# Patient Record
Sex: Female | Born: 1955 | Race: White | Hispanic: No | Marital: Married | Smoking: Never smoker
Health system: Southern US, Community
[De-identification: ages and names within clinical notes are randomized; demographics above are authoritative.]

## PROBLEM LIST (undated history)

## (undated) DIAGNOSIS — E559 Vitamin D deficiency, unspecified: Secondary | ICD-10-CM

## (undated) DIAGNOSIS — T7840XA Allergy, unspecified, initial encounter: Secondary | ICD-10-CM

## (undated) HISTORY — DX: Allergy, unspecified, initial encounter: T78.40XA

## (undated) HISTORY — DX: Vitamin D deficiency, unspecified: E55.9

---

## 2006-12-30 ENCOUNTER — Ambulatory Visit: Payer: Self-pay | Admitting: Gastroenterology

## 2010-06-16 ENCOUNTER — Ambulatory Visit: Payer: Self-pay | Admitting: Internal Medicine

## 2010-06-25 ENCOUNTER — Ambulatory Visit: Payer: Self-pay | Admitting: Gastroenterology

## 2010-06-29 LAB — PATHOLOGY REPORT

## 2011-06-26 ENCOUNTER — Ambulatory Visit: Payer: Self-pay | Admitting: Family Medicine

## 2013-04-15 ENCOUNTER — Emergency Department: Payer: Self-pay | Admitting: Emergency Medicine

## 2013-11-25 DIAGNOSIS — J069 Acute upper respiratory infection, unspecified: Secondary | ICD-10-CM | POA: Insufficient documentation

## 2014-10-05 DIAGNOSIS — R1012 Left upper quadrant pain: Secondary | ICD-10-CM | POA: Insufficient documentation

## 2014-10-05 DIAGNOSIS — Z86010 Personal history of colonic polyps: Secondary | ICD-10-CM | POA: Insufficient documentation

## 2014-10-17 ENCOUNTER — Ambulatory Visit: Payer: Self-pay | Admitting: Gastroenterology

## 2014-11-18 LAB — HM PAP SMEAR: HM PAP: NEGATIVE

## 2014-12-26 LAB — SURGICAL PATHOLOGY

## 2015-06-08 ENCOUNTER — Telehealth: Payer: Self-pay

## 2015-06-08 NOTE — Telephone Encounter (Signed)
Noted-jh 

## 2015-06-08 NOTE — Telephone Encounter (Signed)
Pt has been aware that she has fibrocystic breast and recommendation for 6 month but she is declined and wants to get yearly diagnostic mammogram next appointment is 10/11/2015 and she will schedule her own appointment too ? Nisha

## 2015-10-11 LAB — HM MAMMOGRAPHY: HM Mammogram: NEGATIVE

## 2015-12-08 ENCOUNTER — Telehealth: Payer: Self-pay | Admitting: Family Medicine

## 2015-12-08 NOTE — Telephone Encounter (Signed)
Pt was exposed to pertussis at work.  She checked her My Chart and could not find documentation of the TDap shot she got in March.  Will you check and make sure it is in her records..  She will need proof of that for work.  Her call back number is [phone]

## 2015-12-08 NOTE — Telephone Encounter (Signed)
Tdap updated from [employer].

## 2016-03-07 ENCOUNTER — Ambulatory Visit (INDEPENDENT_AMBULATORY_CARE_PROVIDER_SITE_OTHER): Payer: Managed Care, Other (non HMO) | Admitting: Family Medicine

## 2016-03-07 ENCOUNTER — Encounter: Payer: Self-pay | Admitting: Family Medicine

## 2016-03-07 VITALS — BP 123/79 | HR 65 | Temp 98.5°F | Resp 16 | Ht 68.0 in | Wt 172.4 lb

## 2016-03-07 DIAGNOSIS — Z01419 Encounter for gynecological examination (general) (routine) without abnormal findings: Secondary | ICD-10-CM | POA: Diagnosis not present

## 2016-03-07 DIAGNOSIS — N816 Rectocele: Secondary | ICD-10-CM

## 2016-03-07 DIAGNOSIS — K5901 Slow transit constipation: Secondary | ICD-10-CM | POA: Diagnosis not present

## 2016-03-07 DIAGNOSIS — E559 Vitamin D deficiency, unspecified: Secondary | ICD-10-CM | POA: Diagnosis not present

## 2016-03-07 DIAGNOSIS — K5900 Constipation, unspecified: Secondary | ICD-10-CM | POA: Insufficient documentation

## 2016-03-07 LAB — LIPID PANEL
CHOLESTEROL: 174 mg/dL (ref 125–200)
HDL: 51 mg/dL (ref >=46)
LDL Cholesterol: 101 mg/dL (ref <130)
TRIGLYCERIDES: 112 mg/dL (ref <150)
Total CHOL/HDL Ratio: 3.4 Ratio (ref <=5.0)
VLDL: 22 mg/dL (ref <30)

## 2016-03-07 LAB — COMPLETE METABOLIC PANEL WITH GFR
ALT: 14 U/L (ref 6–29)
AST: 15 U/L (ref 10–35)
Albumin: 4.1 g/dL (ref 3.6–5.1)
Alkaline Phosphatase: 53 U/L (ref 33–130)
BUN: 14 mg/dL (ref 7–25)
CALCIUM: 9.2 mg/dL (ref 8.6–10.4)
CHLORIDE: 105 mmol/L (ref 98–110)
CO2: 28 mmol/L (ref 20–31)
Creat: 0.85 mg/dL (ref 0.50–0.99)
GFR, EST AFRICAN AMERICAN: 86 mL/min (ref >=60)
GFR, Est Non African American: 75 mL/min (ref >=60)
Glucose, Bld: 92 mg/dL (ref 65–99)
Potassium: 4.5 mmol/L (ref 3.5–5.3)
Sodium: 140 mmol/L (ref 135–146)
TOTAL PROTEIN: 6.4 g/dL (ref 6.1–8.1)
Total Bilirubin: 0.8 mg/dL (ref 0.2–1.2)

## 2016-03-07 NOTE — Assessment & Plan Note (Signed)
Small. Expectant management at this time.. Discussed treatment options- plan to control constipation. Encouraged pelvic muscle strengthening.

## 2016-03-07 NOTE — Assessment & Plan Note (Signed)
Encouraged drinking plenty of fluids. Adding PRN benefiber or metamucil to help with regularity. Encouraged attempting BM after breakfast. Consider medication if not improving.

## 2016-03-07 NOTE — Patient Instructions (Signed)
Piriformis Syndrome With Rehab Piriformis syndrome is a condition the affects the nervous system in the area of the hip, and is characterized by pain and possibly a loss of feeling in the backside (posterior) thigh that may extend down the entire length of the leg. The symptoms are caused by an increase in pressure on the sciatic nerve by the piriformis muscle, which is on the back of the hip and is responsible for externally rotating the hip. The sciatic nerve and its branches connect to much of the leg. Normally the sciatic nerve runs between the piriformis muscle and other muscles. However, in certain individuals the nerve runs through the muscle, which causes an increase in pressure on the nerve and results in the symptoms of piriformis syndrome. SYMPTOMS   Pain, tingling, numbness, or burning in the back of the thigh that may also extend down the entire leg.  Occasionally, tenderness in the buttock.  Loss of function of the leg.  Pain that worsens when using the piriformis muscle (running, jumping, or stairs).  Pain that increases with prolonged sitting.  Pain that is lessened by lying flat on the back. CAUSES   Piriformis syndrome is the result of an increase in pressure placed on the sciatic nerve. Oftentimes, piriformis syndrome is an overuse injury.  Stress placed on the nerve from a sudden increase in the intensity, frequency, or duration of training.  Compensation of other extremity injuries. RISK INCREASES WITH:  Sports that involve the piriformis muscle (running, walking, or jumping).  You are born with (congenital) a defect in which the sciatic nerve passes through the muscle. PREVENTION  Warm up and stretch properly before activity.  Allow for adequate recovery between workouts.  Maintain physical fitness:  Strength, flexibility, and endurance.  Cardiovascular fitness. PROGNOSIS  If treated properly, the symptoms of piriformis syndrome usually resolve in 2 to 6  weeks. RELATED COMPLICATIONS   Persistent and possibly permanent pain and numbness in the lower extremity.  Weakness of the extremity that may progress to disability and inability to compete. TREATMENT  The most effective treatment for piriformis syndrome is rest from any activities that aggravate the symptoms. Ice and pain medication may help reduce pain and inflammation. The use of strengthening and stretching exercises may help reduce pain with activity. These exercises may be performed at home or with a therapist. A referral to a therapist may be given for further evaluation and treatment, such as ultrasound. Corticosteroid injections may be given to reduce inflammation that is causing pressure to be placed on the sciatic nerve. If nonsurgical (conservative) treatment is unsuccessful, then surgery may be recommended.  MEDICATION   If pain medication is necessary, then nonsteroidal anti-inflammatory medications, such as aspirin and ibuprofen, or other minor pain relievers, such as acetaminophen, are often recommended.  Do not take pain medication for 7 days before surgery.  Prescription pain relievers may be given if deemed necessary by your caregiver. Use only as directed and only as much as you need.  Corticosteroid injections may be given by your caregiver. These injections should be reserved for the most serious cases, because they may only be given a certain number of times. HEAT AND COLD:   Cold treatment (icing) relieves pain and reduces inflammation. Cold treatment should be applied for 10 to 15 minutes every 2 to 3 hours for inflammation and pain and immediately after any activity that aggravates your symptoms. Use ice packs or massage the area with a piece of ice (ice massage).  Heat   treatment may be used prior to performing the stretching and strengthening activities prescribed by your caregiver, physical therapist, or athletic trainer. Use a heat pack or soak the injury in warm  water. SEEK IMMEDIATE MEDICAL CARE IF:  Treatment seems to offer no benefit, or the condition worsens.  Any medications produce adverse side effects. EXERCISES RANGE OF MOTION (ROM) AND STRETCHING EXERCISES - Piriformis Syndrome These exercises may help you when beginning to rehabilitate your injury. Your symptoms may resolve with or without further involvement from your physician, physical therapist, or athletic trainer. While completing these exercises, remember:   Restoring tissue flexibility helps normal motion to return to the joints. This allows healthier, less painful movement and activity.  An effective stretch should be held for at least 30 seconds.  A stretch should never be painful. You should only feel a gentle lengthening or release in the stretched tissue. STRETCH - Hip Rotators  Lie on your back on a firm surface. Grasp your right / left knee with your right / left hand and your ankle with your opposite hand.  Keeping your hips and shoulders firmly planted, gently pull your right / left knee and rotate your lower leg toward your opposite shoulder until you feel a stretch in your buttocks.  Hold this stretch for __________ seconds. Repeat this stretch __________ times. Complete this stretch __________ times per day. STRETCH - Iliotibial Band  On the floor or bed, lie on your side so your right / left leg is on top. Bend your knee and grab your ankle.  Slowly bring your knee back so that your thigh is in line with your trunk. Keep your heel at your buttocks and gently arch your back so your head, shoulders, and hips line up.  Slowly lower your leg so that your knee approaches the floor/bed until you feel a gentle stretch on the outside of your right / left thigh. If you do not feel a stretch and your knee will not fall farther, place the heel of your opposite foot on top of your knee and pull your thigh down farther.  Hold this stretch for __________ seconds. Repeat  __________ times. Complete __________ times per day. STRENGTHENING EXERCISES - Piriformis Syndrome  These are some of the caregiver again or until your symptoms are resolved. Remember:   Strong muscles with good endurance tolerate stress better.  Do the exercises as initially prescribed by your caregiver. Progress slowly with each exercise, gradually increasing the number of repetitions and weight used under their guidance. STRENGTH - Hip Abductors, Straight Leg Raises Be aware of your form throughout the entire exercise so that you exercise the correct muscles. Sloppy form means that you are not strengthening the correct muscles.  Lie on your side so that your head, shoulders, knee, and hip line up. You may bend your lower knee to help maintain your balance. Your right / left leg should be on top.  Roll your hips slightly forward, so that your hips are stacked directly over each other and your right / left knee is facing forward.  Lift your top leg up 4-6 inches, leading with your heel. Be sure that your foot does not drift forward or that your knee does not roll toward the ceiling.  Hold this position for __________ seconds. You should feel the muscles in your outer hip lifting (you may not notice this until your leg begins to tire).  Slowly lower your leg to the starting position. Allow the muscles to fully   relax before beginning the next repetition. Repeat __________ times. Complete this exercise __________ times per day.  STRENGTH - Hip Abductors, Quadruped  On a firm, lightly padded surface, position yourself on your hands and knees. Your hands should be directly below your shoulders and your knees should be directly below your hips.  Keeping your right / left knee bent, lift your leg out to the side. Keep your legs level and in line with your shoulders.  Position yourself on your hands and knees.  Hold for __________ seconds.  Keeping your trunk steady and your hips level, slowly  lower your leg to the starting position. Repeat __________ times. Complete this exercise __________ times per day.  STRENGTH - Hip Abductors, Standing  Tie one end of a rubber exercise band/tubing to a secure surface (table, pole) and tie a loop at the other end.  Place the loop around your right / left ankle. Keeping your ankle with the band directly opposite of the secured end, step away until there is tension in the tube/band.  Hold onto a chair as needed for balance.  Keeping your back upright, your shoulders over your hips, and your toes pointing forward, lift your right / left leg out to your side. Be sure to lift your leg with your hip muscles. Do not "throw" your leg or tip your body to lift your leg.  Slowly and with control, return to the starting position. Repeat exercise __________ times. Complete this exercise __________ times per day.    This information is not intended to replace advice given to you by your health care provider. Make sure you discuss any questions you have with your health care provider.   Document Released: 08/19/2005 Document Revised: 01/03/2015 Document Reviewed: 12/01/2008 Elsevier Interactive Patient Education 2016 [employer]  

## 2016-03-07 NOTE — Assessment & Plan Note (Signed)
Check vitamin D levels today. 

## 2016-03-07 NOTE — Progress Notes (Signed)
Subjective:    Patient ID: Alexandra Ellis, female    DOB: Feb 12, 1956, 60 y.o.   MRN: [id number]  HPI: Alexandra Ellis is a 60 y.o. female presenting on 03/07/2016 for Annual Exam   HPI  Pt presents for well-woman exam. Overall doing well at home. Had mammogram 10/11/2015- normal. Had pap smear March 2016- normal and HPV negative.  Constipation: Trying to drink lots of fluids. Has a BM every 3 days. Occasional straining. Type 2-3 on [facility] stool chart.  Recto coele- recent- noticed 3 mos ago. Felt it all the sudden there. No incontinence. Not bulging outside the introitous. Makes it harder to have a bowel movement.  Working on exercise and meditation.  Bilateral thumb pain- doesn't prevent her from activities. Got better with prednisone.   Past Medical History  Diagnosis Date  . Vitamin D deficiency   . Allergy     minor   Social History   Social History  . Marital Status: Married    Spouse Name: N/A  . Number of Children: N/A  . Years of Education: N/A   Occupational History  . Not on file.   Social History Main Topics  . Smoking status: Never Smoker   . Smokeless tobacco: Not on file  . Alcohol Use: Yes  . Drug Use: No  . Sexual Activity: Not on file   Other Topics Concern  . Not on file   Social History Narrative  . No narrative on file   Family History  Problem Relation Age of Onset  . Stroke Mother   . Cancer Mother     skin  . Heart disease Father   . Cancer Father     skin  . Diabetes Father    No current outpatient prescriptions on file prior to visit.   No current facility-administered medications on file prior to visit.    Review of Systems  Constitutional: Negative for fever and chills.  HENT: Negative.   Respiratory: Negative for cough, chest tightness and wheezing.   Cardiovascular: Negative for chest pain and leg swelling.  Gastrointestinal: Negative for nausea, vomiting, abdominal pain, diarrhea and constipation.  Endocrine: Negative.   Negative for cold intolerance, heat intolerance, polydipsia, polyphagia and polyuria.  Genitourinary: Negative for dysuria, vaginal bleeding, vaginal discharge, difficulty urinating, vaginal pain and pelvic pain.       Rectocele or bulge in vagina.   Musculoskeletal: Negative.   Neurological: Negative for dizziness, light-headedness and numbness.  Psychiatric/Behavioral: Negative.    Per HPI unless specifically indicated above     Objective:    BP 123/79 mmHg  Pulse 65  Temp(Src) 98.5 F (36.9 C) (Oral)  Resp 16  Ht 5\' 8"  (1.727 m)  Wt 172 lb 6.4 oz (78.2 kg)  BMI 26.22 kg/m2  LMP   Wt Readings from Last 3 Encounters:  03/07/16 172 lb 6.4 oz (78.2 kg)    Physical Exam  Constitutional: She is oriented to person, place, and time. She appears well-developed and well-nourished.  HENT:  Head: Normocephalic and atraumatic.  Neck: Normal range of motion. Neck supple. No thyromegaly present.  Cardiovascular: Normal rate and regular rhythm.  Exam reveals no gallop and no friction rub.   No murmur heard. Pulmonary/Chest: Breath sounds normal. No respiratory distress. She has no wheezes. Right breast exhibits no inverted nipple, no mass, no nipple discharge, no skin change and no tenderness. Left breast exhibits no inverted nipple, no mass, no nipple discharge, no skin change and no tenderness. Breasts are symmetrical.  Abdominal: Soft. Bowel sounds are normal. She exhibits no distension. There is no tenderness.  Genitourinary: Vagina normal and uterus normal. No breast swelling, tenderness or discharge. There is no rash or tenderness on the right labia. There is no rash, tenderness or lesion on the left labia. Uterus is not tender. Cervix exhibits no motion tenderness. Right adnexum displays no mass, no tenderness and no fullness. Left adnexum displays no mass, no tenderness and no fullness. No tenderness in the vagina.  Small rectocele felt when patient bears down.   Musculoskeletal: Normal  range of motion. She exhibits no edema or tenderness.  Lymphadenopathy:    She has no cervical adenopathy.  Neurological: She is alert and oriented to person, place, and time.  Skin: Skin is warm and dry.  Psychiatric: She has a normal mood and affect. Her behavior is normal. Judgment normal.   Results for orders placed or performed in visit on 03/07/16  HM PAP SMEAR  Result Value Ref Range   HM Pap smear negative       Assessment & Plan:   Problem List Items Addressed This Visit      Digestive   Constipation    Encouraged drinking plenty of fluids. Adding PRN benefiber or metamucil to help with regularity. Encouraged attempting BM after breakfast. Consider medication if not improving.       Rectocele    Small. Expectant management at this time.. Discussed treatment options- plan to control constipation. Encouraged pelvic muscle strengthening.         Other   Vitamin D deficiency    Check vitamin D levels today.       Relevant Orders   VITAMIN D 25 Hydroxy (Vit-D Deficiency, Fractures)    Other Visit Diagnoses    Well woman exam with routine gynecological exam    -  Primary    Health maintenance reviewed. Pap due 2021.     Relevant Orders    Lipid Profile    COMPLETE METABOLIC PANEL WITH GFR       Meds ordered this encounter  Medications  . cholecalciferol (VITAMIN D) 1000 units tablet    Sig: Take 1,000 Units by mouth daily. Pt takes 3000 units daily.  . calcium carbonate (OSCAL) 1500 (600 Ca) MG TABS tablet    Sig: Take by mouth daily.      Follow up plan: No Follow-up on file.

## 2016-03-08 LAB — VITAMIN D 25 HYDROXY (VIT D DEFICIENCY, FRACTURES): VIT D 25 HYDROXY: 58 ng/mL (ref 30–100)

## 2018-04-30 ENCOUNTER — Other Ambulatory Visit (HOSPITAL_COMMUNITY)
Admission: RE | Admit: 2018-04-30 | Discharge: 2018-04-30 | Disposition: A | Discharge status: Home / Self Care | Payer: Managed Care, Other (non HMO) | Source: Ambulatory Visit | Attending: Nurse Practitioner | Admitting: Nurse Practitioner

## 2018-04-30 ENCOUNTER — Telehealth: Payer: Self-pay | Admitting: Nurse Practitioner

## 2018-04-30 ENCOUNTER — Ambulatory Visit (INDEPENDENT_AMBULATORY_CARE_PROVIDER_SITE_OTHER): Payer: Managed Care, Other (non HMO) | Admitting: Nurse Practitioner

## 2018-04-30 ENCOUNTER — Other Ambulatory Visit: Payer: Self-pay

## 2018-04-30 ENCOUNTER — Encounter: Payer: Self-pay | Admitting: Nurse Practitioner

## 2018-04-30 VITALS — BP 119/70 | HR 67 | Temp 98.2°F | Ht 68.0 in | Wt 177.6 lb

## 2018-04-30 DIAGNOSIS — Z1151 Encounter for screening for human papillomavirus (HPV): Secondary | ICD-10-CM | POA: Diagnosis not present

## 2018-04-30 DIAGNOSIS — Z124 Encounter for screening for malignant neoplasm of cervix: Secondary | ICD-10-CM | POA: Diagnosis not present

## 2018-04-30 DIAGNOSIS — Z1211 Encounter for screening for malignant neoplasm of colon: Secondary | ICD-10-CM

## 2018-04-30 DIAGNOSIS — Z1231 Encounter for screening mammogram for malignant neoplasm of breast: Secondary | ICD-10-CM | POA: Diagnosis not present

## 2018-04-30 DIAGNOSIS — G5703 Lesion of sciatic nerve, bilateral lower limbs: Secondary | ICD-10-CM

## 2018-04-30 DIAGNOSIS — Z13820 Encounter for screening for osteoporosis: Secondary | ICD-10-CM

## 2018-04-30 DIAGNOSIS — Z1239 Encounter for other screening for malignant neoplasm of breast: Secondary | ICD-10-CM

## 2018-04-30 DIAGNOSIS — Z0000 Encounter for general adult medical examination without abnormal findings: Secondary | ICD-10-CM | POA: Diagnosis not present

## 2018-04-30 NOTE — Telephone Encounter (Signed)
[place] and [place] at [facility].  Her call back number [phone]

## 2018-04-30 NOTE — Progress Notes (Signed)
Subjective:    Patient ID: Alexandra Ellis, female    DOB: March 26, 1956, 62 y.o.   MRN: [id number]  Alexandra Ellis is a 62 y.o. female presenting on 04/30/2018 for Annual Exam and Hip Pain (intermitent bilateral hip pain. That sometime disturb sleep. x 1week)   HPI Annual Physical Exam Patient has been feeling well.  They have acute concerns today regarding hip pain. Sleeps 7-8 hours per night interrupted x 1 for urination. Has had hip pain and interruption of sleep related to turning over.  Pain occurring with every position change.   Caring for mother-in-law in [place] who also has dementia.  Now in Memory care [facility]F.    HEALTH MAINTENANCE: Weight/BMI: overweight Physical activity: regular at home, good program Diet: generally heathy, smaller lunch, fewer carbs.  Preparing lunch more frequently. Reducing convenience foods/meals out. Seatbelt: always Sunscreen: wears usually if prolonged exposure PAP: due - no prior abnormal pap Mammogram: due - last 10/2015 DEXA: last about 15-20 years ago,  Colon Cancer Screen: due - was 3-5 years ago HIV/HEP C: negative - no new exposures Optometry: regular - Dr. Larence Penning Dentistry: Regular will be 2x per year  VACCINES: Tetanus: 2015 Influenza: will get with work Shingles: not interested   Hip pain Is taking 600mg  ibuprofen 1 dose at night with some relief. - Patient has had similar hip pain in past with diagnosis of piriformis syndrome and exercises that helped resolve pain.  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   Past Medical History:  Diagnosis Date  . Allergy    minor  . Vitamin D deficiency    No past surgical history on file. Social History   Socioeconomic History  . Marital status: Married    Spouse name: Not on file  . Number of children: Not on file  . Years of education: Not on file  . Highest education level: Not on file  Occupational  History  . Not on file  Social Needs  . Financial resource strain: Not on file  . Food insecurity:    Worry: Not on file    Inability: Not on file  . Transportation needs:    Medical: Not on file    Non-medical: Not on file  Tobacco Use  . Smoking status: Never Smoker  . Smokeless tobacco: Never Used  Substance and Sexual Activity  . Alcohol use: Yes    Comment: ocassional  . Drug use: No  . Sexual activity: Not on file  Lifestyle  . Physical activity:    Days per week: Not on file    Minutes per session: Not on file  . Stress: Not on file  Relationships  . Social connections:    Talks on phone: Not on file    Gets together: Not on file    Attends religious service: Not on file    Active member of club or organization: Not on file    Attends meetings of clubs or organizations: Not on file    Relationship status: Not on file  . Intimate partner violence:    Fear of current or ex partner: Not on file    Emotionally abused: Not on file    Physically abused: Not on file    Forced sexual activity: Not on file  Other Topics Concern  . Not on file  Social History Narrative  . Not on file   Family History  Problem Relation Age  of Onset  . Stroke Mother   . Cancer Mother        skin  . Heart disease Father   . Cancer Father        skin  . Diabetes Father    Current Outpatient Medications on File Prior to Visit  Medication Sig  . Calcium-Magnesium-Vitamin D (CALCIUM 500 PO) Take by mouth.  . cholecalciferol (VITAMIN D) 1000 units tablet Take 1,000 Units by mouth daily. Pt takes 3000 units daily.   No current facility-administered medications on file prior to visit.     Review of Systems  Constitutional: Negative for chills and fever.  HENT: Negative for congestion and sore throat.   Eyes: Negative for pain.  Respiratory: Negative for cough, shortness of breath and wheezing.   Cardiovascular: Negative for chest pain, palpitations and leg swelling.    Gastrointestinal: Negative for abdominal pain, blood in stool, constipation, diarrhea, nausea and vomiting.  Endocrine: Negative for polydipsia.  Genitourinary: Negative for dysuria, frequency, hematuria and urgency.  Musculoskeletal: Positive for myalgias (bilateral hips). Negative for back pain and neck pain.  Skin: Negative.  Negative for rash.  Allergic/Immunologic: Negative for environmental allergies.  Neurological: Negative for dizziness, weakness and headaches.  Hematological: Does not bruise/bleed easily.  Psychiatric/Behavioral: Positive for sleep disturbance (only last 7 days 2/2 hip pain). Negative for dysphoric mood and suicidal ideas. The patient is not nervous/anxious.    Per HPI unless specifically indicated above     Objective:    BP 119/70 (BP Location: Left Arm, Patient Position: Sitting, Cuff Size: Normal)   Pulse 67   Temp 98.2 F (36.8 C) (Oral)   Ht 5\' 8"  (1.727 m)   Wt 177 lb 9.6 oz (80.6 kg)   BMI 27.00 kg/m   Wt Readings from Last 3 Encounters:  04/30/18 177 lb 9.6 oz (80.6 kg)  03/07/16 172 lb 6.4 oz (78.2 kg)    Physical Exam  Constitutional: She is oriented to person, place, and time. She appears well-developed and well-nourished. No distress.  HENT:  Head: Normocephalic and atraumatic.  Right Ear: External ear normal.  Left Ear: External ear normal.  Nose: Nose normal.  Mouth/Throat: Oropharynx is clear and moist.  Eyes: Pupils are equal, round, and reactive to light. Conjunctivae are normal.  Neck: Normal range of motion. Neck supple. No JVD present. No tracheal deviation present. No thyromegaly present.  Cardiovascular: Normal rate, regular rhythm, normal heart sounds and intact distal pulses. Exam reveals no gallop and no friction rub.  No murmur heard. Pulmonary/Chest: Effort normal and breath sounds normal. No respiratory distress.  Abdominal: Soft. Bowel sounds are normal. She exhibits no distension. There is no hepatosplenomegaly. There  is no tenderness.  Genitourinary:  Genitourinary Comments: Breast - Normal exam w/ symmetric breasts, no mass, no nipple discharge, no skin changes or tenderness.    Normal external female genitalia without lesions or fusion. Vaginal canal without lesions, atrophic ctissues noted. Normal appearing cervix without lesions or friability. Physiologic discharge on exam. Bimanual exam without adnexal masses, enlarged uterus, or cervical motion tenderness.  Musculoskeletal: Normal range of motion.  Bilateral hips Inspection: Symmetric, no abnormality Palpation: Bilateral hips (lateral most aspect) with tenderness to palpation.  Muscles with hypertonicity. ROM: Normal hip AROM bilaterally Special Testing: none Strength: normal Neurovascular: normal sensation and cap refill, warm to touch    Lymphadenopathy:    She has no cervical adenopathy.  Neurological: She is alert and oriented to person, place, and time. No cranial nerve deficit.  Skin: Skin is warm and dry. Capillary refill takes less than 2 seconds.  Psychiatric: She has a normal mood and affect. Her behavior is normal. Judgment and thought content normal.  Nursing note and vitals reviewed.    Results for orders placed or performed in visit on 03/07/16  HM PAP SMEAR  Result Value Ref Range   HM Pap smear negative   VITAMIN D 25 Hydroxy (Vit-D Deficiency, Fractures)  Result Value Ref Range   Vit D, 25-Hydroxy 58 30 - 100 ng/mL  Lipid Profile  Result Value Ref Range   Cholesterol 174 125 - 200 mg/dL   Triglycerides 161 <096 mg/dL   HDL 51 >=04 mg/dL   Total CHOL/HDL Ratio 3.4 <=5.0 Ratio   VLDL 22 <30 mg/dL   LDL Cholesterol 540 <981 mg/dL  COMPLETE METABOLIC PANEL WITH GFR  Result Value Ref Range   Sodium 140 135 - 146 mmol/L   Potassium 4.5 3.5 - 5.3 mmol/L   Chloride 105 98 - 110 mmol/L   CO2 28 20 - 31 mmol/L   Glucose, Bld 92 65 - 99 mg/dL   BUN 14 7 - 25 mg/dL   Creat 1.91 4.78 - 2.95 mg/dL   Total Bilirubin 0.8 0.2 -  1.2 mg/dL   Alkaline Phosphatase 53 33 - 130 U/L   AST 15 10 - 35 U/L   ALT 14 6 - 29 U/L   Total Protein 6.4 6.1 - 8.1 g/dL   Albumin 4.1 3.6 - 5.1 g/dL   Calcium 9.2 8.6 - 62.1 mg/dL   GFR, Est African American 86 >=60 mL/min   GFR, Est Non African American 75 >=60 mL/min      Assessment & Plan:   Problem List Items Addressed This Visit    None    Visit Diagnoses    Breast cancer screening    -  Primary   Relevant Orders   MM 3D SCREEN BREAST BILATERAL   Osteoporosis screening       Relevant Orders   DG Bone Density   Encounter for annual physical exam       Relevant Orders   MM 3D SCREEN BREAST BILATERAL   DG Bone Density   Hemoglobin A1c   CBC with Differential/Platelet   COMPLETE METABOLIC PANEL WITH GFR   Lipid panel   Cervical cancer screening       Relevant Orders   Cytology - PAP   Piriformis syndrome of both sides         Annual Physical exam with new findings of piriformis syndrome.  Well adult with no other acute concerns.  Plan: 1. Obtain health maintenance screenings as above according to age. - Increase physical activity to 30 minutes most days of the week.  - Eat healthy diet high in vegetables and fruits; low in refined carbohydrates. - Patient due for screenings: mammogram, DEXA, colonoscopy.  Needs orders sent and referral placed for GI for colonoscopy. Patient to call with in-network provider locations to complete referral process.  2. Return 1 year for annual physical.   Piriformis syndrome Pain likely self-limited.  Muscle strain possible complicated by repetitive use/overuse syndrome.  Plan:  1. Treat with OTC pain meds (acetaminophen and ibuprofen).  Discussed alternate dosing and max dosing. - Take ibuprofen 400 mg tid for 14 days, then prn. 2. Apply heat and/or ice to affected area. 3. May also apply a muscle rub with lidocaine or lidocaine patch after heat or ice. 4. Provided exercises to help stretch/strengthen piriformis  muscle.   Consider PT in future if needed. 5. Follow up prn.    Follow up plan: Return in about 1 year (around 05/01/2019) for annual physical AND as needed for hip pain.  Wilhelmina McardleLauren Errika Narvaiz, DNP, AGPCNP-BC Adult Gerontology Primary Care Nurse Practitioner [facility] 04/30/2018, 9:51 AM

## 2018-04-30 NOTE — Patient Instructions (Addendum)
Alexandra PikesSusan Schiller,   Thank you for coming in to clinic today.  1. Shingrix - Shingles vaccine: 2 doses 2-6 months apart.  [email or web address] for more information if you have questions.  2. Your mammogram and bone density orders have been placed.  Let us know where you would like these to be faxed for you in-network.  The same is true for your gastroenterology referral for colonoscopy.  3. Continue working toward healthy eating habits and regular physical activity. Increase your physical activity until you are increasing your heart rate for 30 minutes on most days of the week.   4. You have piriformis muscle strain. Likely caused by repetitive overuse - Start taking naproxen 220-440 mg 1-2 tablets twice daily for 14 days.  May also take Tylenol extra strength 1 to 2 tablets every 6-8 hours for aches or fever/chills for next few days as needed.  Do not take more than 3,000 mg in 24 hours from all medicines.   - Use heat and ice.  Apply this for 15 minutes at a time 6-8 times per day.   - Muscle rub with lidocaine, lidocaine patch, Biofreeze, or tiger balm for topical pain relief.  Avoid using this with heat and ice to avoid burns.  Continue with exercises as provided.  Please schedule a follow-up appointment with Wilhelmina McardleLauren Kadra Kohan, AGNP. Return in about 1 year (around 05/01/2019) for annual physical AND as needed for hip pain.  If you have any other questions or concerns, please feel free to call the clinic or send a message through MyChart. You may also schedule an earlier appointment if necessary.  You will receive a survey after today's visit either digitally by e-mail or paper by [employer] mail. Your experiences and feedback matter to us.  Please respond so we know how we are doing as we provide care for you.   Wilhelmina McardleLauren Gurley Climer, DNP, AGNP-BC Adult Gerontology Nurse Practitioner [facility], [id number]

## 2018-05-01 LAB — COMPLETE METABOLIC PANEL WITH GFR
AG Ratio: 1.9 (calc) (ref 1.0–2.5)
ALT: 18 U/L (ref 6–29)
AST: 16 U/L (ref 10–35)
Albumin: 4.2 g/dL (ref 3.6–5.1)
Alkaline phosphatase (APISO): 55 U/L (ref 33–130)
BUN: 17 mg/dL (ref 7–25)
CO2: 28 mmol/L (ref 20–32)
Calcium: 9.6 mg/dL (ref 8.6–10.4)
Chloride: 105 mmol/L (ref 98–110)
Creat: 0.99 mg/dL (ref 0.50–0.99)
GFR, Est African American: 71 mL/min/{1.73_m2} (ref >=60)
GFR, Est Non African American: 61 mL/min/{1.73_m2} (ref >=60)
Globulin: 2.2 g/dL (calc) (ref 1.9–3.7)
Glucose, Bld: 92 mg/dL (ref 65–99)
Potassium: 4.7 mmol/L (ref 3.5–5.3)
Sodium: 140 mmol/L (ref 135–146)
Total Bilirubin: 0.7 mg/dL (ref 0.2–1.2)
Total Protein: 6.4 g/dL (ref 6.1–8.1)

## 2018-05-01 LAB — CBC WITH DIFFERENTIAL/PLATELET
Basophils Absolute: 50 cells/uL (ref 0–200)
Basophils Relative: 0.8 %
Eosinophils Absolute: 99 cells/uL (ref 15–500)
Eosinophils Relative: 1.6 %
HCT: 43.1 % (ref 35.0–45.0)
Hemoglobin: 14.2 g/dL (ref 11.7–15.5)
Lymphs Abs: 1649 cells/uL (ref 850–3900)
MCH: 30.2 pg (ref 27.0–33.0)
MCHC: 32.9 g/dL (ref 32.0–36.0)
MCV: 91.7 fL (ref 80.0–100.0)
MPV: 11.9 fL (ref 7.5–12.5)
Monocytes Relative: 6.8 %
Neutro Abs: 3980 cells/uL (ref 1500–7800)
Neutrophils Relative %: 64.2 %
Platelets: 237 10*3/uL (ref 140–400)
RBC: 4.7 10*6/uL (ref 3.80–5.10)
RDW: 12.1 % (ref 11.0–15.0)
Total Lymphocyte: 26.6 %
WBC mixed population: 422 cells/uL (ref 200–950)
WBC: 6.2 10*3/uL (ref 3.8–10.8)

## 2018-05-01 LAB — HEMOGLOBIN A1C
Hgb A1c MFr Bld: 5.5 % of total Hgb (ref <5.7)
Mean Plasma Glucose: 111 (calc)
eAG (mmol/L): 6.2 (calc)

## 2018-05-01 LAB — LIPID PANEL
Cholesterol: 166 mg/dL (ref <200)
HDL: 54 mg/dL (ref >50)
LDL Cholesterol (Calc): 93 mg/dL (calc)
Non-HDL Cholesterol (Calc): 112 mg/dL (calc) (ref <130)
Total CHOL/HDL Ratio: 3.1 (calc) (ref <5.0)
Triglycerides: 101 mg/dL (ref <150)

## 2018-05-01 NOTE — Addendum Note (Signed)
Addended by: Vernard GamblesKENNEDY, Lakin Romer R on: 05/01/2018 01:23 PM   Modules accepted: Orders

## 2018-05-01 NOTE — Telephone Encounter (Signed)
Order is changed to Alexandra Ellis.  Patient needs to call and schedule. -  Call the Scheduling phone number at [phone] to schedule your mammogram at your convenience.    Referral to [facility] is placed for colonoscopy.  They will call to schedule.

## 2018-05-01 NOTE — Telephone Encounter (Signed)
I left a detial message on the patient vm with all necessary information.

## 2018-05-01 NOTE — Addendum Note (Signed)
Addended by: Wilhelmina McardleKENNEDY, Lewis Grivas R on: 05/01/2018 01:21 PM   Modules accepted: Orders

## 2018-05-05 ENCOUNTER — Other Ambulatory Visit: Payer: Self-pay

## 2018-05-05 DIAGNOSIS — Z86010 Personal history of colonic polyps: Secondary | ICD-10-CM

## 2018-05-05 LAB — CYTOLOGY - PAP
Diagnosis: NEGATIVE
HPV: NOT DETECTED

## 2018-05-06 ENCOUNTER — Telehealth: Payer: Self-pay | Admitting: Nurse Practitioner

## 2018-05-06 ENCOUNTER — Telehealth: Payer: Self-pay | Admitting: Gastroenterology

## 2018-05-06 NOTE — Telephone Encounter (Signed)
Left vm with [facility] to cancel pt's procedure with Dr. Servando Snare on 06/01/18.

## 2018-05-06 NOTE — Telephone Encounter (Signed)
Patient states due to insurance coverage she will not be able to do the procedure at this time and wants to cancel with Dr.Wohl for 9.30.19.

## 2018-05-06 NOTE — Telephone Encounter (Signed)
Pt called requesting her colonoscopy order be fax to [phone] [facility]. Pt call back  # is [phone]

## 2018-05-07 ENCOUNTER — Encounter: Payer: Self-pay | Admitting: Radiology

## 2018-05-07 ENCOUNTER — Ambulatory Visit
Admission: RE | Admit: 2018-05-07 | Discharge: 2018-05-07 | Disposition: A | Discharge status: Home / Self Care | Payer: Managed Care, Other (non HMO) | Source: Ambulatory Visit | Attending: Nurse Practitioner | Admitting: Nurse Practitioner

## 2018-05-07 DIAGNOSIS — Z1239 Encounter for other screening for malignant neoplasm of breast: Secondary | ICD-10-CM

## 2018-05-07 DIAGNOSIS — Z0000 Encounter for general adult medical examination without abnormal findings: Secondary | ICD-10-CM | POA: Insufficient documentation

## 2018-05-07 DIAGNOSIS — Z13820 Encounter for screening for osteoporosis: Secondary | ICD-10-CM | POA: Insufficient documentation

## 2018-05-08 ENCOUNTER — Encounter: Payer: Self-pay | Admitting: Nurse Practitioner

## 2018-05-15 ENCOUNTER — Inpatient Hospital Stay
Admission: RE | Admit: 2018-05-15 | Discharge: 2018-05-15 | Disposition: A | Discharge status: Home / Self Care | Payer: Self-pay | Source: Ambulatory Visit | Attending: *Deleted | Admitting: *Deleted

## 2018-05-15 ENCOUNTER — Other Ambulatory Visit: Payer: Self-pay | Admitting: *Deleted

## 2018-05-15 DIAGNOSIS — Z9289 Personal history of other medical treatment: Secondary | ICD-10-CM

## 2018-05-25 ENCOUNTER — Encounter: Payer: Self-pay | Admitting: Nurse Practitioner

## 2018-05-25 ENCOUNTER — Other Ambulatory Visit: Payer: Self-pay | Admitting: Nurse Practitioner

## 2018-05-25 DIAGNOSIS — N6320 Unspecified lump in the left breast, unspecified quadrant: Secondary | ICD-10-CM

## 2018-05-25 DIAGNOSIS — N6489 Other specified disorders of breast: Secondary | ICD-10-CM

## 2018-05-25 DIAGNOSIS — R928 Other abnormal and inconclusive findings on diagnostic imaging of breast: Secondary | ICD-10-CM

## 2018-05-28 ENCOUNTER — Encounter: Payer: Self-pay | Admitting: Nurse Practitioner

## 2018-05-28 NOTE — Telephone Encounter (Signed)
Mychart message

## 2018-06-01 ENCOUNTER — Inpatient Hospital Stay
Admission: RE | Admit: 2018-06-01 | Discharge: 2018-06-01 | Disposition: A | Discharge status: Home / Self Care | Payer: Self-pay | Source: Ambulatory Visit | Attending: *Deleted | Admitting: *Deleted

## 2018-06-01 ENCOUNTER — Ambulatory Visit: Admit: 2018-06-01 | Payer: Managed Care, Other (non HMO) | Admitting: Gastroenterology

## 2018-06-01 ENCOUNTER — Other Ambulatory Visit: Payer: Self-pay | Admitting: *Deleted

## 2018-06-01 DIAGNOSIS — Z9289 Personal history of other medical treatment: Secondary | ICD-10-CM

## 2018-06-01 SURGERY — COLONOSCOPY WITH PROPOFOL
Anesthesia: Choice

## 2018-06-04 ENCOUNTER — Ambulatory Visit
Admission: RE | Admit: 2018-06-04 | Discharge: 2018-06-04 | Disposition: A | Discharge status: Home / Self Care | Payer: Managed Care, Other (non HMO) | Source: Ambulatory Visit | Attending: Nurse Practitioner | Admitting: Nurse Practitioner

## 2018-06-04 DIAGNOSIS — R928 Other abnormal and inconclusive findings on diagnostic imaging of breast: Secondary | ICD-10-CM | POA: Insufficient documentation

## 2018-06-04 DIAGNOSIS — N6320 Unspecified lump in the left breast, unspecified quadrant: Secondary | ICD-10-CM | POA: Insufficient documentation

## 2018-06-04 DIAGNOSIS — N6489 Other specified disorders of breast: Secondary | ICD-10-CM | POA: Diagnosis present

## 2019-05-04 ENCOUNTER — Encounter: Payer: Managed Care, Other (non HMO) | Admitting: Nurse Practitioner

## 2020-05-07 IMAGING — US US BREAST*L* LIMITED INC AXILLA
1 series · 11 of 11 positions shown · non-contrast
Comparison: Previous exam(s).

CLINICAL DATA: Patient recalled from screening for right breast
asymmetry and left breast mass.

EXAM:
DIGITAL DIAGNOSTIC BILATERAL MAMMOGRAM WITH CAD AND TOMO
ULTRASOUND LEFT BREAST

[Series 1: us breast*left* limited inc axilla · 0.07mm/px · 11 of 11 slices shown]
[im 1/11]
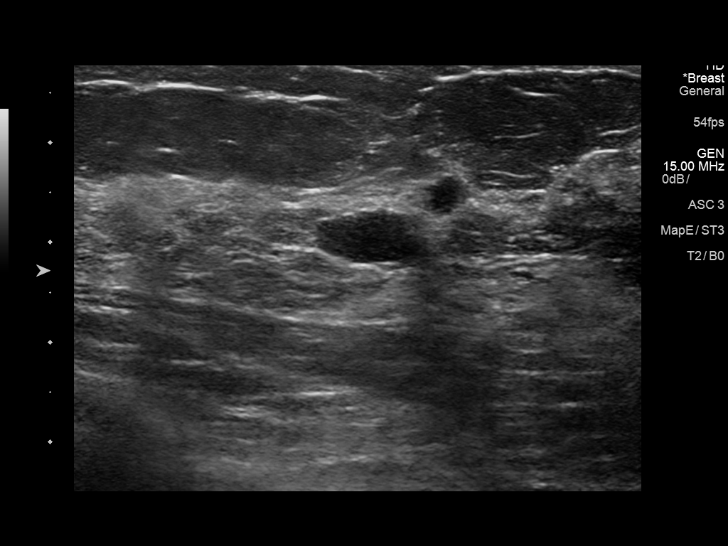
[im 2/11]
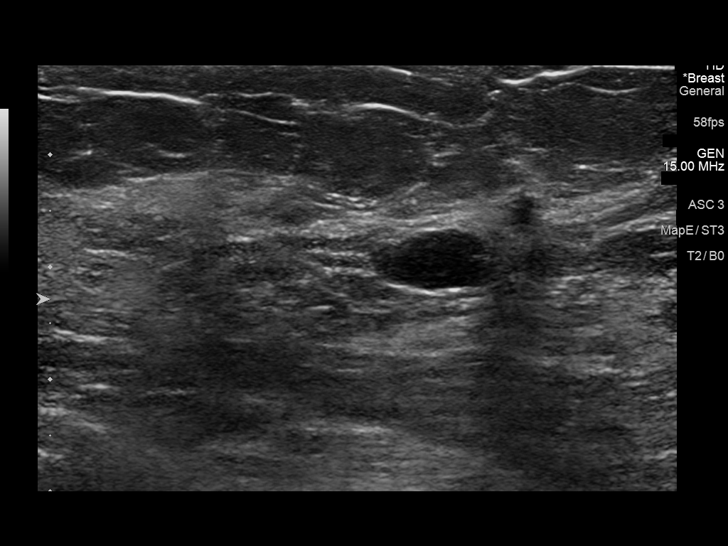
[im 3/11]
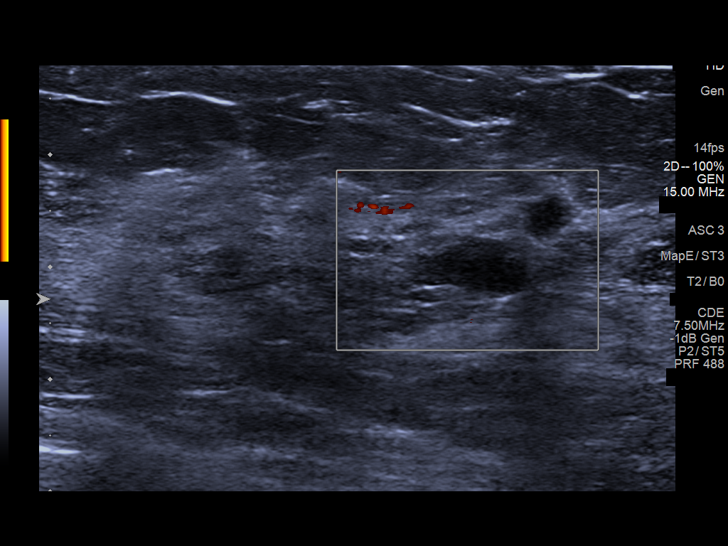
[im 4/11]
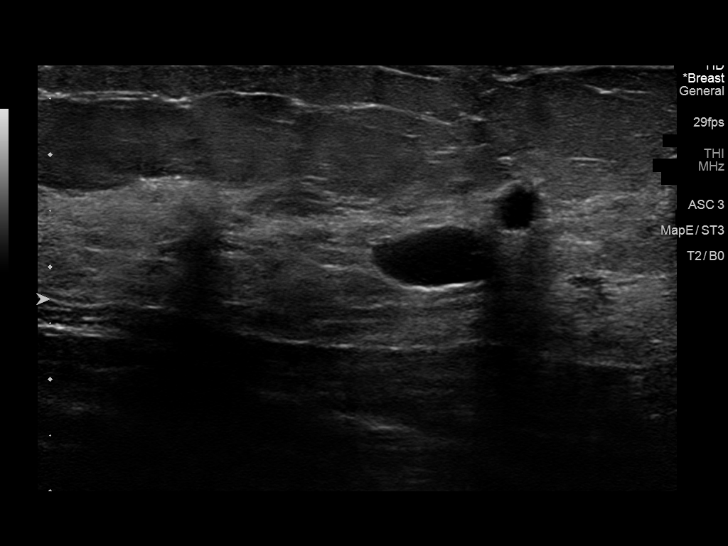
[im 5/11]
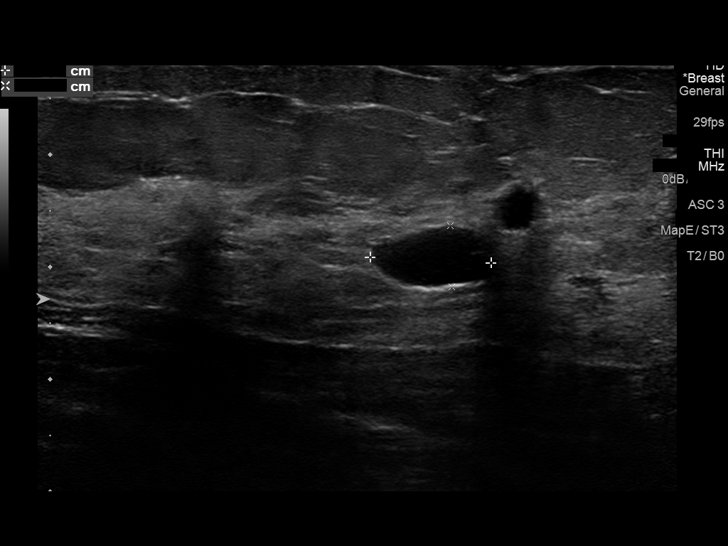
[im 6/11]
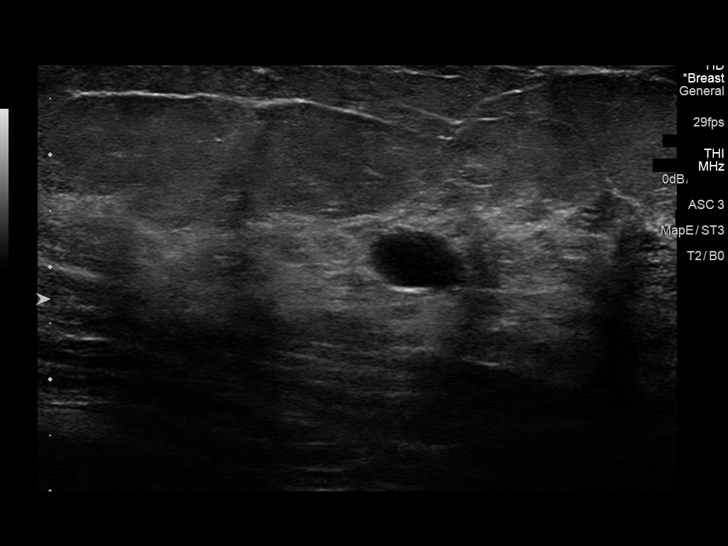
[im 7/11]
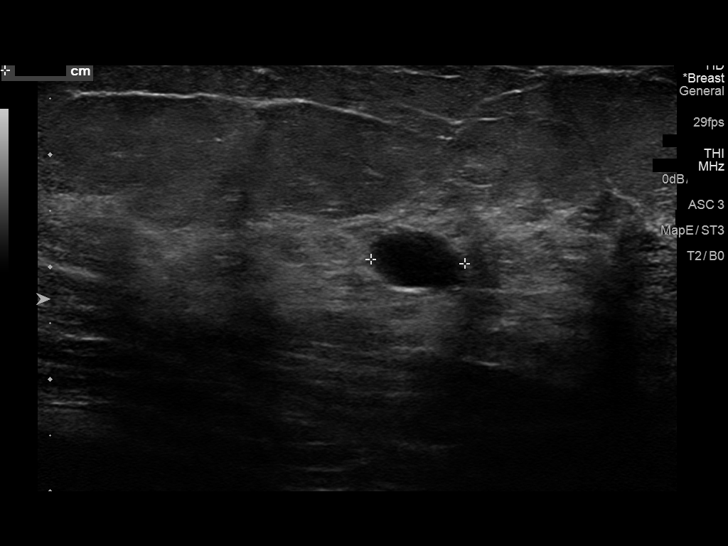
[im 8/11]
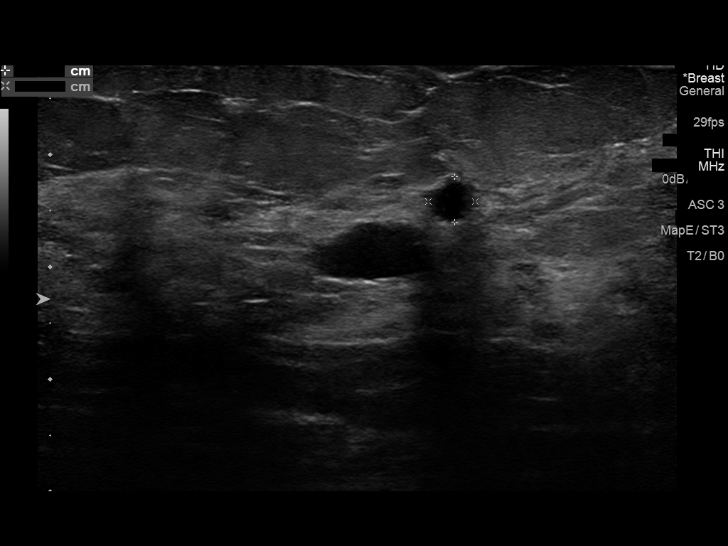
[im 9/11]
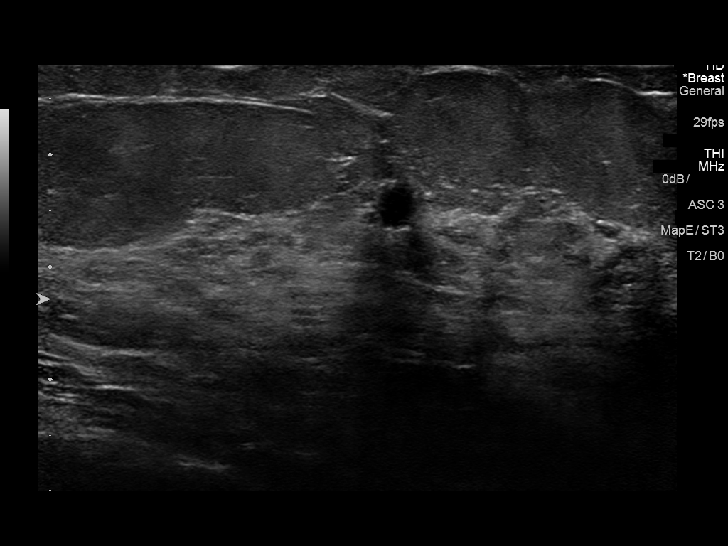
[im 10/11]
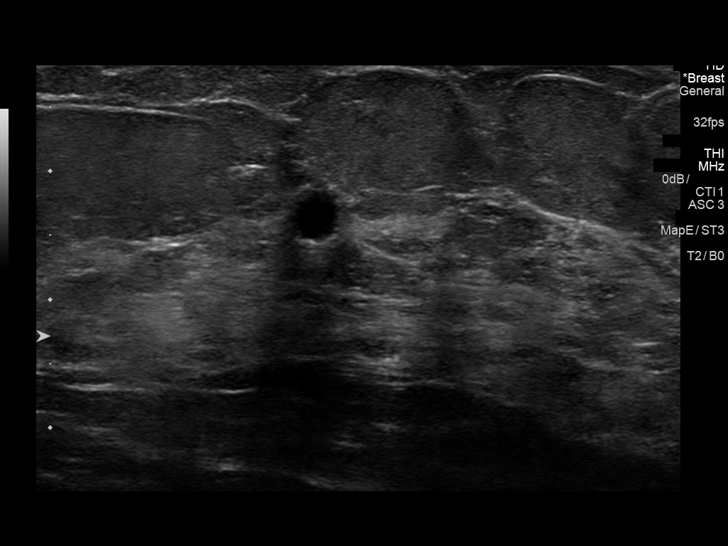
[im 11/11]
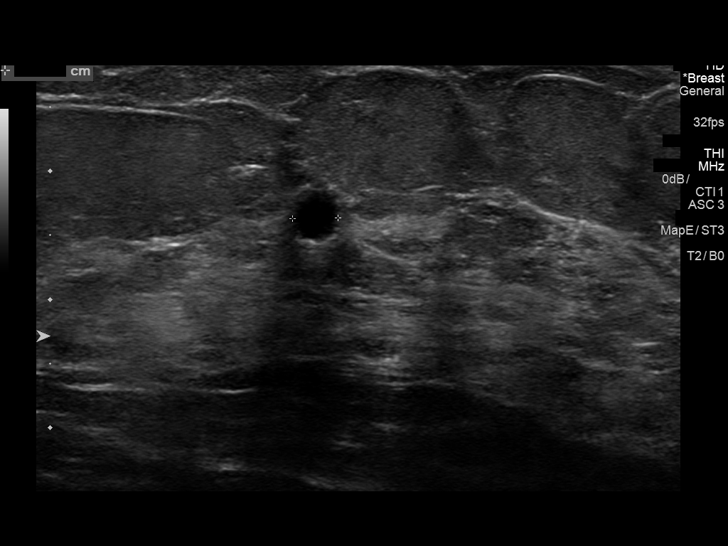

[11 of 11 positions shown; findings below may reference images not displayed]

ACR Breast Density Category c: The breast tissue is heterogeneously
dense, which may obscure small masses.
FINDINGS: Questioned asymmetry within the medial aspect of the right breast
compatible with normal fibroglandular tissue on additional imaging.

Within the 11 o'clock position left breast middle depth there is a
persistent low-density oval circumscribed mass.

Mammographic images were processed with CAD.

Targeted ultrasound is performed, showing two adjacent simple cysts
left breast 11 o'clock position 3 cm from the nipple with the
largest measuring 11 x 5 x 8 mm and the smaller cyst measuring 4 x 4
x 4 mm.
IMPRESSION: Left breast simple cysts.

No evidence for malignancy in either breast.

RECOMMENDATION:
Screening mammogram in one year.(Code:96-P-6CW)

I have discussed the findings and recommendations with the patient.
Results were also provided in writing at the conclusion of the
visit. If applicable, a reminder letter will be sent to the patient
regarding the next appointment.

BI-RADS CATEGORY  2: Benign.

## 2020-12-29 ENCOUNTER — Other Ambulatory Visit: Payer: Self-pay | Admitting: Nurse Practitioner

## 2020-12-29 DIAGNOSIS — Z1231 Encounter for screening mammogram for malignant neoplasm of breast: Secondary | ICD-10-CM

## 2021-01-03 ENCOUNTER — Other Ambulatory Visit: Payer: Self-pay

## 2021-01-03 ENCOUNTER — Ambulatory Visit
Admission: RE | Admit: 2021-01-03 | Discharge: 2021-01-03 | Disposition: A | Discharge status: Home / Self Care | Payer: Managed Care, Other (non HMO) | Source: Ambulatory Visit | Attending: Nurse Practitioner | Admitting: Nurse Practitioner

## 2021-01-03 DIAGNOSIS — Z1231 Encounter for screening mammogram for malignant neoplasm of breast: Secondary | ICD-10-CM | POA: Insufficient documentation

## 2021-07-04 ENCOUNTER — Other Ambulatory Visit: Payer: Self-pay

## 2021-07-04 ENCOUNTER — Ambulatory Visit
Admission: EM | Admit: 2021-07-04 | Discharge: 2021-07-04 | Disposition: A | Discharge status: Home / Self Care | Payer: Managed Care, Other (non HMO)

## 2021-07-04 ENCOUNTER — Ambulatory Visit (INDEPENDENT_AMBULATORY_CARE_PROVIDER_SITE_OTHER): Payer: Managed Care, Other (non HMO)

## 2021-07-04 ENCOUNTER — Encounter: Payer: Self-pay | Admitting: Emergency Medicine

## 2021-07-04 DIAGNOSIS — M25511 Pain in right shoulder: Secondary | ICD-10-CM | POA: Diagnosis not present

## 2021-07-04 DIAGNOSIS — M7521 Bicipital tendinitis, right shoulder: Secondary | ICD-10-CM | POA: Diagnosis not present

## 2021-07-04 NOTE — ED Triage Notes (Signed)
Pt c/o right shoulder pain x 8 months. Pt has had PT for the past 2 months which has improved but she wants to make sure there is no other issues in her shoulder preventing her from improvement.

## 2021-07-04 NOTE — Discharge Instructions (Addendum)
You may take Tylenol or ibuprofen as needed for discomfort.  Use topical Voltaren gel as directed.  Continue to participate in physical therapy.  You may apply ice to the area as needed throughout the day to help with discomfort.  If you begin to notice any worsening symptoms such as new or worse range of motion deficit, worsening pain please follow-up with orthopedics.

## 2021-07-04 NOTE — ED Provider Notes (Signed)
Chief Complaint   Chief Complaint  Patient presents with   Shoulder Pain     Subjective, HPI  Alexandra Ellis is a 65 y.o. female who presents with right shoulder pain for the last 8 months.  Patient reports having had physical therapy for the past 2 months which has improved, but wants to make sure that there are no other issues in her shoulder preventing her from improving fully.  No additional concerns or symptoms today.  Patient does report that she is between primary care providers.  History obtained from patient.  Patient's problem list, past medical and social history, medications, and allergies were reviewed by me and updated in Epic.    ROS  See HPI.  Objective   Vitals:   07/04/21 1757  BP: (!) 145/84  Pulse: 70  Temp: 98.6 F (37 C)    Vital signs and nursing note reviewed.   General: Appears well-developed and well-nourished. No acute distress.  Head: Normocephalic and atraumatic.   Neck: Normal range of motion, neck is supple.  Cardiovascular: Normal rate. Pulm/Chest: No respiratory distress.  Musculoskeletal: Right shoulder: TTP to insertion point of right bicep. 5/5 strength, full sensation. Neurological: Alert and oriented to person, place, and time.  Skin: Skin is warm and dry.   Psychiatric: Normal mood, affect, behavior, and thought content.    Data  No results found for any visits on 07/04/21.   Imaging Right shoulder: On my read, no fracture or dislocation noted. Pending final interpretation.   Assessment & Plan  1. Biceps tendinitis of right shoulder  65 y.o. female presents with right shoulder pain for the last 8 months.  Patient reports having had physical therapy for the past 2 months which has improved, but wants to make sure that there are no other issues in her shoulder preventing her from improving fully.  No additional concerns or symptoms today.  Patient does report that she is between primary care providers.  Right shoulder: No fracture  or dislocation noted.  Pending final interpretation.  Given symptoms, likely biceps tendinitis.  Advised to take Tylenol or ibuprofen as needed for discomfort, use Voltaren gel as directed, continue to participate in physical therapy, ice the area.  Advised that if she begins to notice any worsening of symptoms such as new or worse range of motion deficit, worsening pain please follow-up with orthopedics.  Patient verbalized understanding and agreed with plan.  Patient stable upon discharge.  Return as needed.  Plan:   Discharge Instructions      You may take Tylenol or ibuprofen as needed for discomfort.  Use topical Voltaren gel as directed.  Continue to participate in physical therapy.  You may apply ice to the area as needed throughout the day to help with discomfort.  If you begin to notice any worsening symptoms such as new or worse range of motion deficit, worsening pain please follow-up with orthopedics.         Amalia Greenhouse, FNP 07/04/21 1910

## 2021-07-05 ENCOUNTER — Ambulatory Visit: Payer: Managed Care, Other (non HMO)

## 2021-12-11 ENCOUNTER — Other Ambulatory Visit: Payer: Self-pay | Admitting: Nurse Practitioner

## 2021-12-11 DIAGNOSIS — Z1231 Encounter for screening mammogram for malignant neoplasm of breast: Secondary | ICD-10-CM

## 2022-01-24 ENCOUNTER — Ambulatory Visit
Admission: RE | Admit: 2022-01-24 | Discharge: 2022-01-24 | Disposition: A | Discharge status: Home / Self Care | Payer: Managed Care, Other (non HMO) | Source: Ambulatory Visit | Attending: Nurse Practitioner | Admitting: Nurse Practitioner

## 2022-01-24 DIAGNOSIS — Z1231 Encounter for screening mammogram for malignant neoplasm of breast: Secondary | ICD-10-CM | POA: Diagnosis present

## 2022-10-03 IMAGING — MG MM DIGITAL SCREENING BILAT W/ TOMO AND CAD
8 series · 9 of 24 positions shown · non-contrast
Comparison: Previous exam(s).

CLINICAL DATA: Screening.

EXAM:
DIGITAL SCREENING BILATERAL MAMMOGRAM WITH TOMOSYNTHESIS AND CAD
TECHNIQUE: Bilateral screening digital craniocaudal and mediolateral oblique
mammograms were obtained. Bilateral screening digital breast
tomosynthesis was performed. The images were evaluated with
computer-aided detection.

[R CC synth-2D]
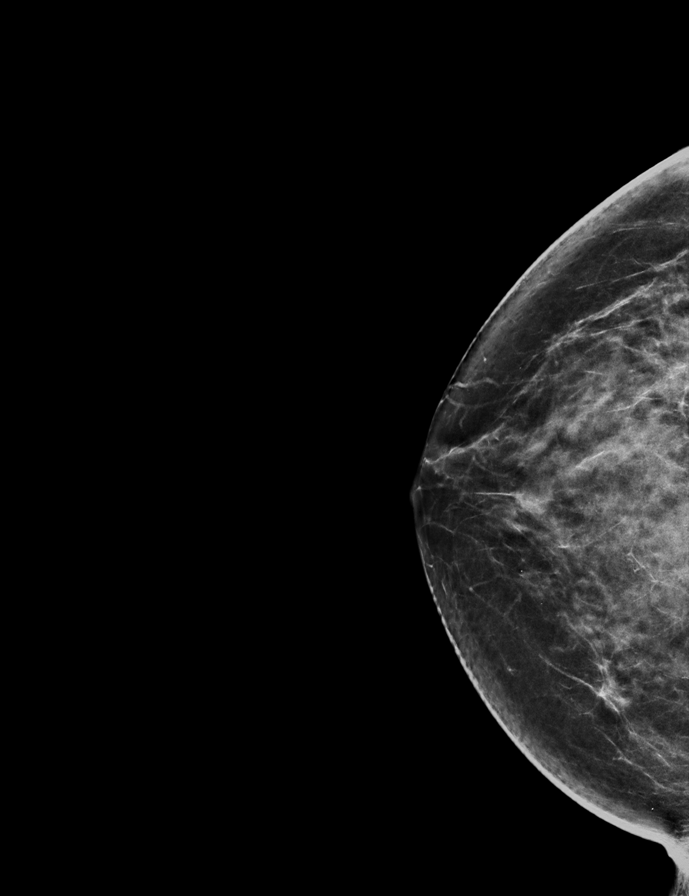

[R MLO synth-2D]
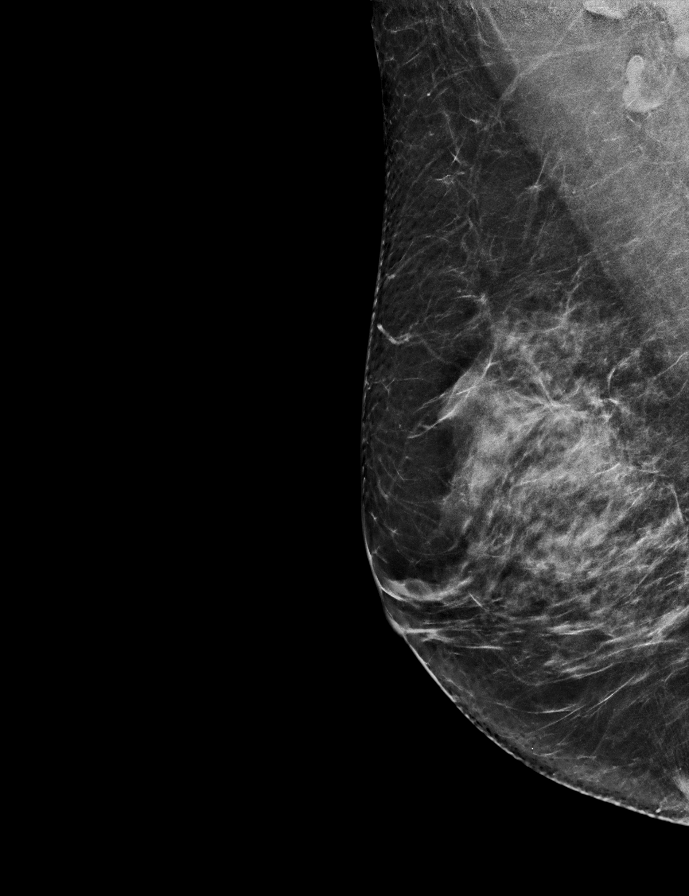

[L MLO synth-2D]
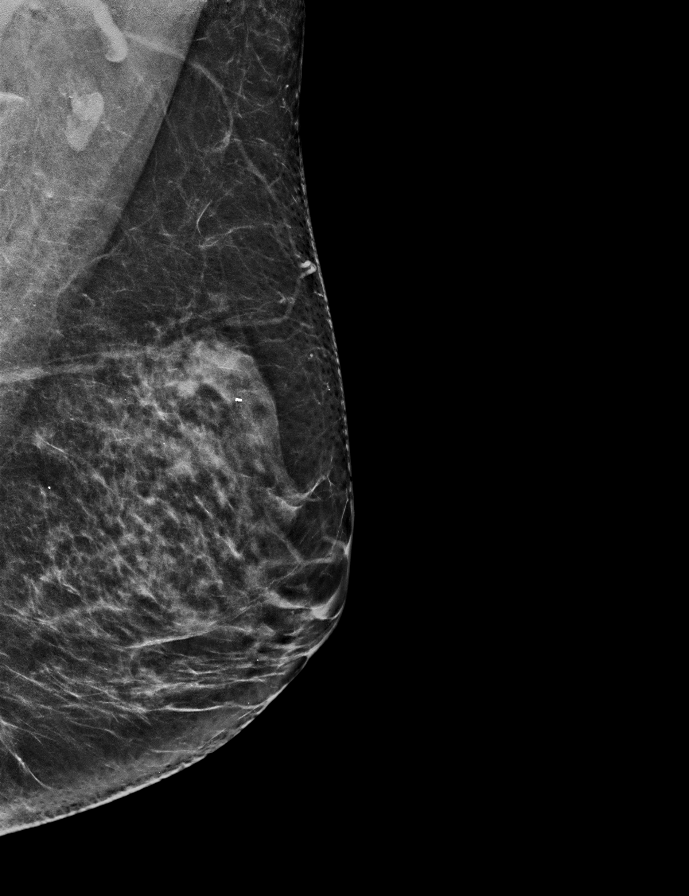

[L CC synth-2D]
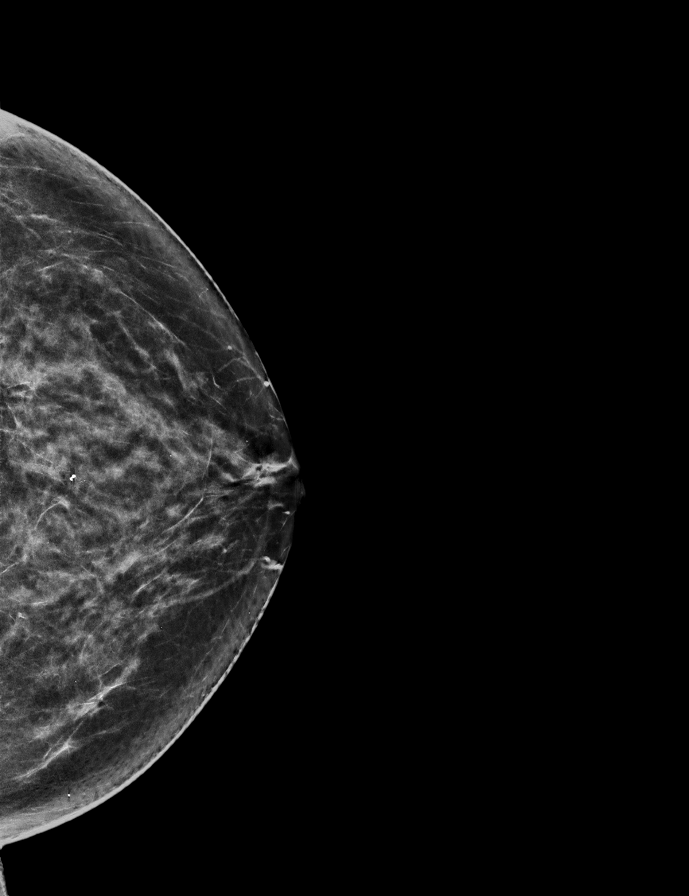

[R CC tomo · 2 of 70 frames shown]
[frame 23/70]
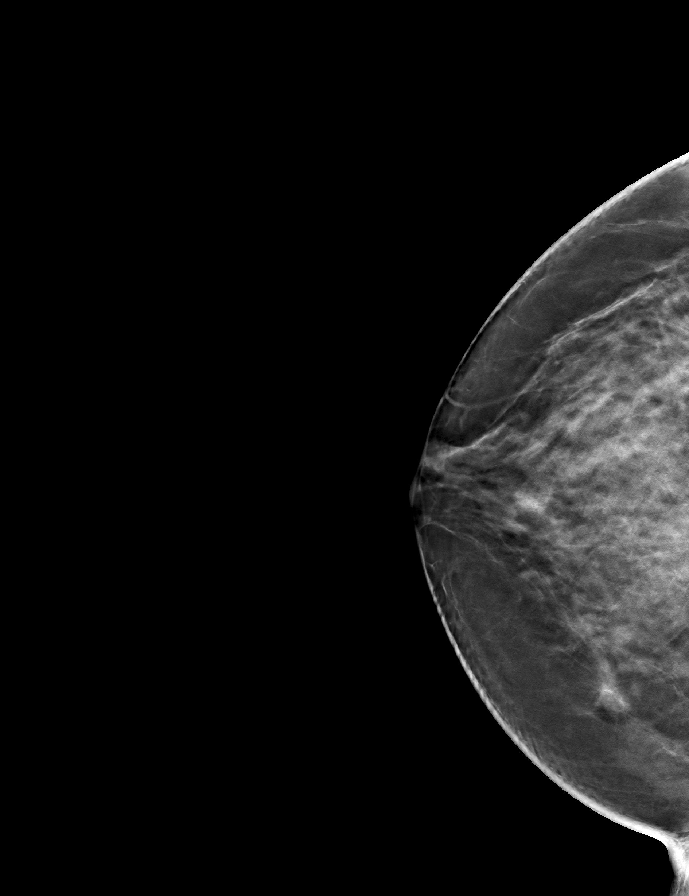
[frame 35/70]
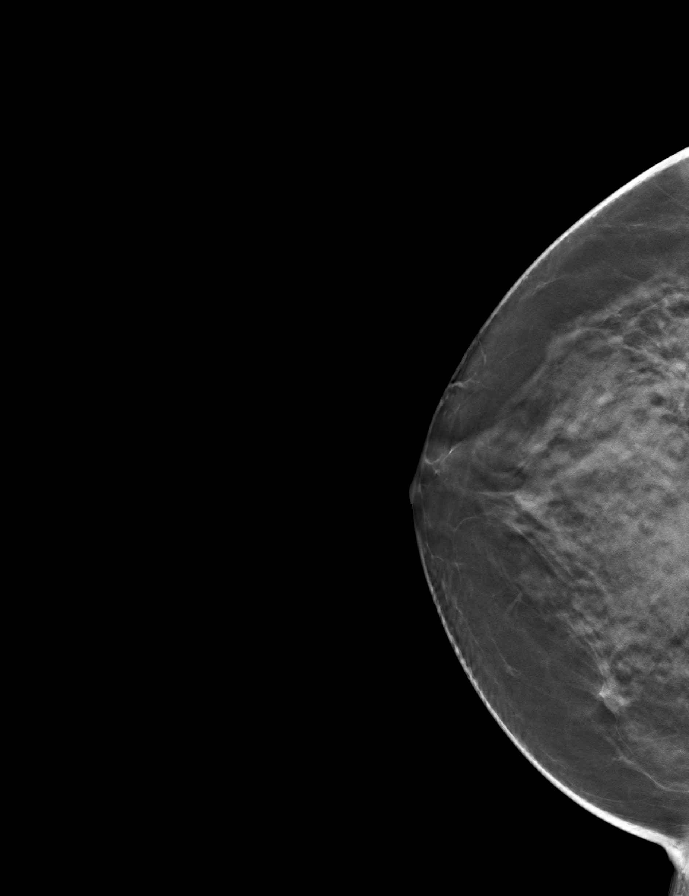

[L CC tomo · tomo slice 33/66.0]
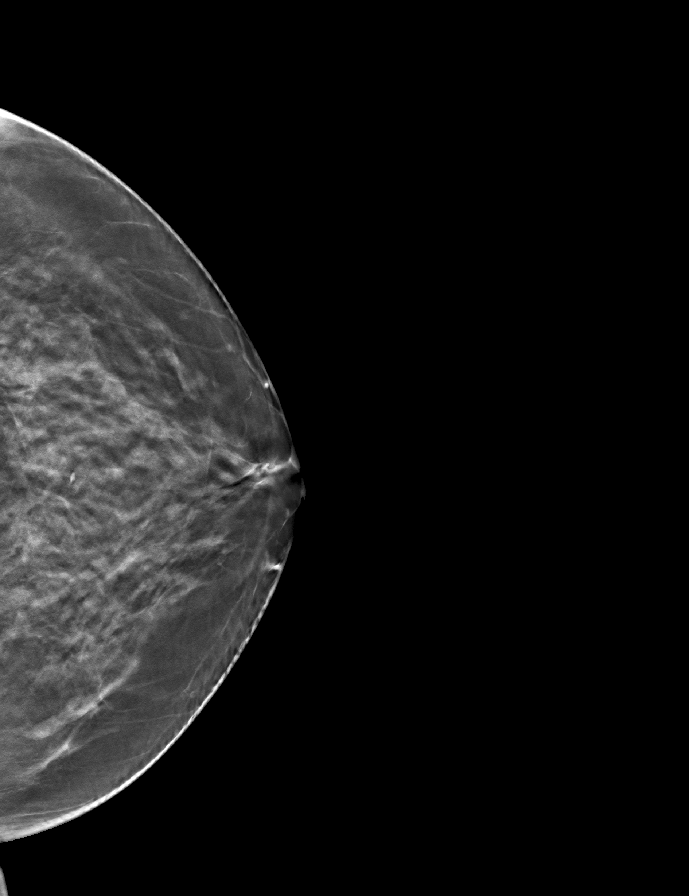

[R MLO tomo · tomo slice 35/69.0]
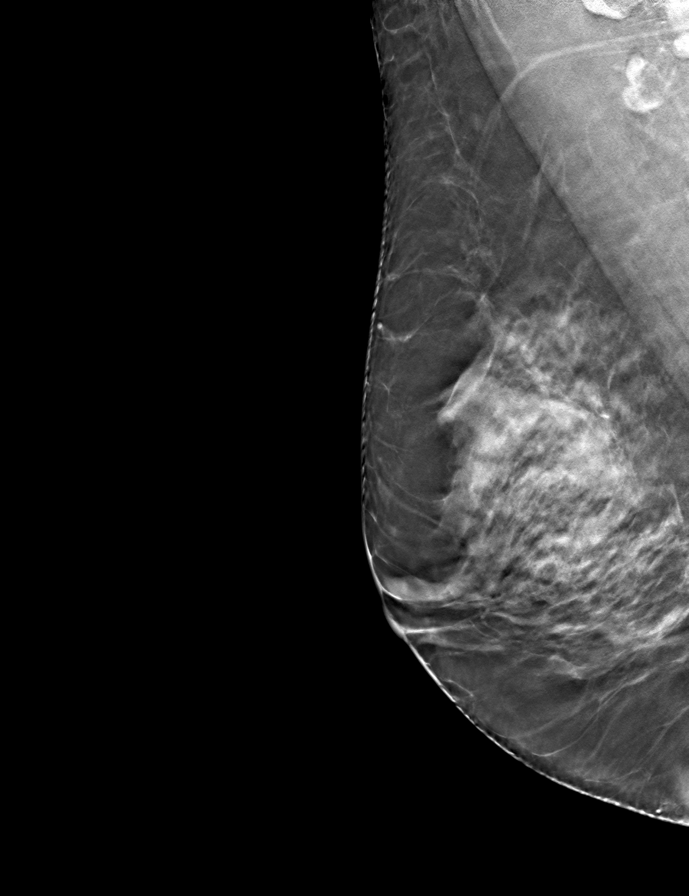

[L MLO tomo · tomo slice 34/67.0]
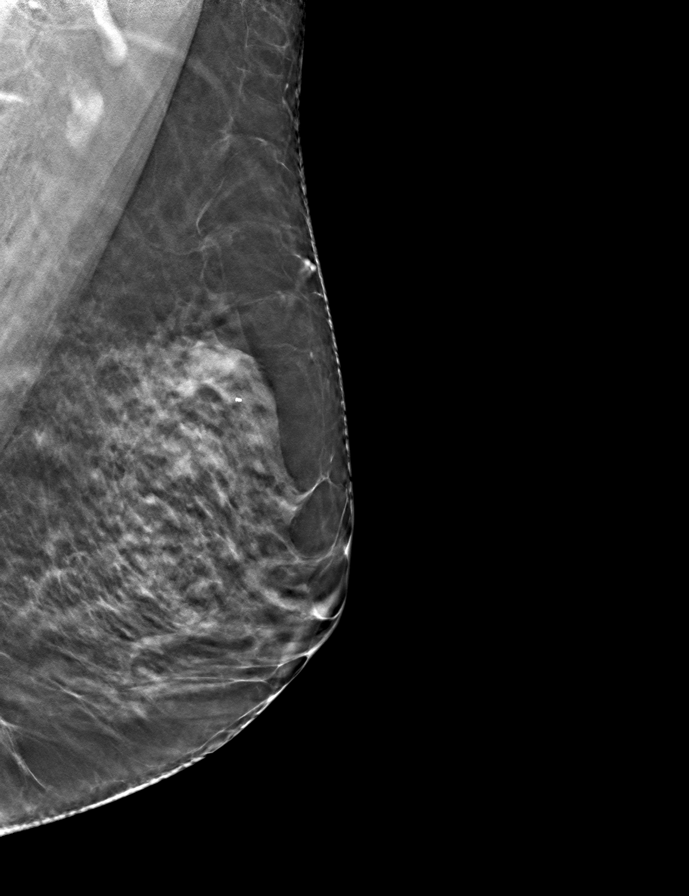

[9 of 24 positions shown; findings below may reference images not displayed]

ACR Breast Density Category c: The breast tissue is heterogeneously
dense, which may obscure small masses.
FINDINGS: There are no findings suspicious for malignancy.
IMPRESSION: No mammographic evidence of malignancy. A result letter of this
screening mammogram will be mailed directly to the patient.

RECOMMENDATION:
Screening mammogram in one year. (Code:Q3-W-BC3)

BI-RADS CATEGORY  1: Negative.

## 2022-12-11 ENCOUNTER — Other Ambulatory Visit: Payer: Self-pay | Admitting: Nurse Practitioner

## 2022-12-11 DIAGNOSIS — Z1231 Encounter for screening mammogram for malignant neoplasm of breast: Secondary | ICD-10-CM

## 2023-01-28 ENCOUNTER — Ambulatory Visit
Admission: RE | Admit: 2023-01-28 | Discharge: 2023-01-28 | Disposition: A | Discharge status: Home / Self Care | Payer: Medicare Other | Source: Ambulatory Visit | Attending: Nurse Practitioner | Admitting: Nurse Practitioner

## 2023-01-28 DIAGNOSIS — Z1231 Encounter for screening mammogram for malignant neoplasm of breast: Secondary | ICD-10-CM | POA: Insufficient documentation

## 2023-01-31 ENCOUNTER — Other Ambulatory Visit: Payer: Self-pay | Admitting: Nurse Practitioner

## 2023-01-31 DIAGNOSIS — R928 Other abnormal and inconclusive findings on diagnostic imaging of breast: Secondary | ICD-10-CM

## 2023-02-04 ENCOUNTER — Ambulatory Visit
Admission: RE | Admit: 2023-02-04 | Discharge: 2023-02-04 | Disposition: A | Discharge status: Home / Self Care | Payer: Medicare Other | Source: Ambulatory Visit | Attending: Nurse Practitioner | Admitting: Nurse Practitioner

## 2023-02-04 DIAGNOSIS — R928 Other abnormal and inconclusive findings on diagnostic imaging of breast: Secondary | ICD-10-CM | POA: Insufficient documentation

## 2023-06-04 ENCOUNTER — Other Ambulatory Visit: Payer: Self-pay | Admitting: Nurse Practitioner

## 2023-06-04 DIAGNOSIS — R928 Other abnormal and inconclusive findings on diagnostic imaging of breast: Secondary | ICD-10-CM

## 2023-08-07 ENCOUNTER — Ambulatory Visit
Admission: RE | Admit: 2023-08-07 | Discharge: 2023-08-07 | Disposition: A | Discharge status: Home / Self Care | Payer: Medicare Other | Source: Ambulatory Visit | Attending: Nurse Practitioner | Admitting: Nurse Practitioner

## 2023-08-07 DIAGNOSIS — R928 Other abnormal and inconclusive findings on diagnostic imaging of breast: Secondary | ICD-10-CM | POA: Diagnosis present

## 2023-08-11 ENCOUNTER — Other Ambulatory Visit: Payer: Self-pay | Admitting: Nurse Practitioner

## 2023-08-11 DIAGNOSIS — R928 Other abnormal and inconclusive findings on diagnostic imaging of breast: Secondary | ICD-10-CM

## 2023-08-13 ENCOUNTER — Ambulatory Visit
Admission: RE | Admit: 2023-08-13 | Discharge: 2023-08-13 | Disposition: A | Discharge status: Home / Self Care | Payer: Medicare Other | Source: Ambulatory Visit | Attending: Internal Medicine | Admitting: Internal Medicine

## 2023-08-13 VITALS — BP 122/83 | HR 67 | Temp 99.0°F | Resp 16 | Ht 68.0 in | Wt 175.0 lb

## 2023-08-13 DIAGNOSIS — H60502 Unspecified acute noninfective otitis externa, left ear: Secondary | ICD-10-CM | POA: Diagnosis not present

## 2023-08-13 MED ORDER — OFLOXACIN 0.3 % OT SOLN
10.0000 [drp] | Freq: Every day | OTIC | 0 refills | Status: AC
Start: 2023-08-13 — End: 2023-08-20

## 2023-08-13 NOTE — ED Provider Notes (Signed)
[facility]    CSN: [id number] Arrival date & time: 08/13/23  1022      History   Chief Complaint Chief Complaint  Patient presents with   Ear Drainage    Appt    HPI Alexandra Ellis is a 67 y.o. female presents for ear pain.  Patient was seen at [facility] urgent care on 12/2 and diagnosed with left otitis media and started on cefdinir.  She states she has 1 day remaining that over the past couple of days she still had some fullness and noticed some drainage from the ear.  No pain but endorses muffled hearing.  No fevers or chills.  No URI symptoms.  No recent swimming or excessive water in ear.  No other concerns this time.   Ear Drainage    Past Medical History:  Diagnosis Date   Allergy    minor   Vitamin D deficiency     Patient Active Problem List   Diagnosis Date Noted   Vitamin D deficiency 03/07/2016   Constipation 03/07/2016   Rectocele 03/07/2016   History of colon polyps 10/05/2014   Abdominal pain, left upper quadrant 10/05/2014   Acute upper respiratory infection 11/25/2013    History reviewed. No pertinent surgical history.  OB History   No obstetric history on file.      Home Medications    Prior to Admission medications   Medication Sig Start Date End Date Taking? Authorizing Provider  Calcium-Magnesium-Vitamin D (CALCIUM 500 PO) Take by mouth.   Yes [provider]  cefdinir (OMNICEF) 300 MG capsule Take by mouth. 08/04/23 08/14/23 Yes [provider]  cholecalciferol (VITAMIN D) 1000 units tablet Take 1,000 Units by mouth daily. Pt takes 3000 units daily.   Yes [provider]  ofloxacin (FLOXIN) 0.3 % OTIC solution Place 10 drops into the left ear daily for 7 days. 08/13/23 08/20/23 Yes Radford Pax, NP  Omega-3 Fatty Acids (FISH OIL PO) Take by mouth.   Yes [provider]  ascorbic acid (VITAMIN C) 250 MG tablet Take by mouth.    [provider]  b complex vitamins capsule Take 1 capsule  by mouth daily.    [provider]  Calcium-Iron-Vit D-Vit K (CALCIUM SOFT CHEWS) 500-09-998-40 MG-UNT-MCG CHEW     [provider]  Cholecalciferol 50 MCG (2000 UT) CHEW     [provider]    Family History Family History  Problem Relation Age of Onset   Stroke Mother    Cancer Mother        skin   Heart disease Father    Cancer Father        skin   Diabetes Father    Breast cancer Paternal Aunt    Breast cancer Maternal Grandmother        mat great gm    Social History Social History   Tobacco Use   Smoking status: Never   Smokeless tobacco: Never  Vaping Use   Vaping status: Never Used  Substance Use Topics   Alcohol use: Yes    Comment: ocassional   Drug use: No     Allergies   Codeine and Egg shells   Review of Systems Review of Systems  HENT:  Positive for ear discharge.      Physical Exam Triage Vital Signs ED Triage Vitals  Encounter Vitals Group     BP 08/13/23 1040 122/83     Systolic BP Percentile --  Diastolic BP Percentile --      Pulse Rate 08/13/23 1040 67     Resp 08/13/23 1040 16     Temp 08/13/23 1040 99 F (37.2 C)     Temp Source 08/13/23 1040 Oral     SpO2 08/13/23 1040 99 %     Weight 08/13/23 1039 175 lb (79.4 kg)     Height 08/13/23 1039 5\' 8"  (1.727 m)     Head Circumference --      Peak Flow --      Pain Score 08/13/23 1042 2     Pain Loc --      Pain Education --      Exclude from Growth Chart --    No data found.  Updated Vital Signs BP 122/83 (BP Location: Left Arm)   Pulse 67   Temp 99 F (37.2 C) (Oral)   Resp 16   Ht 5\' 8"  (1.727 m)   Wt 175 lb (79.4 kg)   SpO2 99%   BMI 26.61 kg/m   Visual Acuity Right Eye Distance:   Left Eye Distance:   Bilateral Distance:    Right Eye Near:   Left Eye Near:    Bilateral Near:     Physical Exam Vitals and nursing note reviewed.  Constitutional:      General: She is not in acute distress.    Appearance: Normal appearance. She  is not ill-appearing.  HENT:     Head: Normocephalic and atraumatic.     Right Ear: Tympanic membrane and ear canal normal.     Left Ear: Swelling present. No mastoid tenderness.     Ears:     Comments: Moderate swelling of left canal with scant mucopurulent drainage.  Partial TM visible and is within normal limits. Eyes:     Pupils: Pupils are equal, round, and reactive to light.  Cardiovascular:     Rate and Rhythm: Normal rate.  Pulmonary:     Effort: Pulmonary effort is normal.  Skin:    General: Skin is warm and dry.  Neurological:     General: No focal deficit present.     Mental Status: She is alert and oriented to person, place, and time.  Psychiatric:        Mood and Affect: Mood normal.        Behavior: Behavior normal.      UC Treatments / Results  Labs (all labs ordered are listed, but only abnormal results are displayed) Labs Reviewed - No data to display  EKG   Radiology No results found.  Procedures Procedures (including critical care time)  Medications Ordered in UC Medications - No data to display  Initial Impression / Assessment and Plan / UC Course  I have reviewed the triage vital signs and the nursing notes.  Pertinent labs & imaging results that were available during my care of the patient were reviewed by me and considered in my medical decision making (see chart for details).     Reviewed exam and symptoms with patient.  No red flags.  Advised to complete cefdinir as previously prescribed.  Will start ofloxacin antibiotic eardrops for 7 days.  Vies keep water there until treatment is complete.  PCP follow-up if symptoms do not improve.  ER precautions reviewed. Final Clinical Impressions(s) / UC Diagnoses   Final diagnoses:  Acute otitis externa of left ear, unspecified type     Discharge Instructions      Complete your cefdinir as previously prescribed.  Start ofloxacin antibiotic eardrops once a day for 7 days.  Keep water out of  the ear until your treatment is complete.  You may use Tylenol or ibuprofen as needed for pain.  Please follow-up with your PCP if your symptoms do not improve.  Please go to the ER for any worsening symptoms.  Hope you feel better soon!    ED Prescriptions     Medication Sig Dispense Auth. Provider   ofloxacin (FLOXIN) 0.3 % OTIC solution Place 10 drops into the left ear daily for 7 days. 5 mL Radford Pax, NP      PDMP not reviewed this encounter.   Radford Pax, NP 08/13/23 1054

## 2023-08-13 NOTE — ED Triage Notes (Signed)
Pt c/o L ear drainage x2 wks. States I'm near completion of course  of antibiotics from diagnosed "acute middle ear infection" but pain and fullness worse. - Entered by patient. Saw [facility] & was given cefdinir x10 days.

## 2023-08-13 NOTE — Discharge Instructions (Addendum)
Complete your cefdinir as previously prescribed.  Start ofloxacin antibiotic eardrops once a day for 7 days.  Keep water out of the ear until your treatment is complete.  You may use Tylenol or ibuprofen as needed for pain.  Please follow-up with your PCP if your symptoms do not improve.  Please go to the ER for any worsening symptoms.  Hope you feel better soon!

## 2023-08-14 ENCOUNTER — Other Ambulatory Visit: Payer: Self-pay | Admitting: Nurse Practitioner

## 2023-08-14 DIAGNOSIS — N6489 Other specified disorders of breast: Secondary | ICD-10-CM

## 2024-02-09 ENCOUNTER — Other Ambulatory Visit: Payer: Medicare Other

## 2024-02-17 ENCOUNTER — Other Ambulatory Visit

## 2024-02-17 ENCOUNTER — Encounter

## 2024-02-26 ENCOUNTER — Ambulatory Visit
Admission: RE | Admit: 2024-02-26 | Discharge: 2024-02-26 | Disposition: A | Discharge status: Home / Self Care | Source: Ambulatory Visit | Attending: Nurse Practitioner | Admitting: Nurse Practitioner

## 2024-02-26 DIAGNOSIS — N6489 Other specified disorders of breast: Secondary | ICD-10-CM
